# Patient Record
Sex: Female | Born: 1950
Health system: Western US, Academic
[De-identification: ages and names within clinical notes are randomized; demographics above are authoritative.]

## PROBLEM LIST (undated history)

## (undated) DIAGNOSIS — I10 Essential (primary) hypertension: Secondary | ICD-10-CM

## (undated) DIAGNOSIS — M1990 Unspecified osteoarthritis, unspecified site: Secondary | ICD-10-CM

## (undated) DIAGNOSIS — E079 Disorder of thyroid, unspecified: Secondary | ICD-10-CM

## (undated) HISTORY — DX: Disorder of thyroid, unspecified: E07.9

## (undated) HISTORY — PX: PR UNLISTED PROCEDURE LEG/ANKLE: 27899

## (undated) HISTORY — PX: BACK SURGERY: SHX5067

## (undated) HISTORY — PX: PR UNLISTED PROCEDURE SPINE: 22899

## (undated) HISTORY — DX: Unspecified osteoarthritis, unspecified site: M19.90

## (undated) HISTORY — PX: PR UNLISTED PROCEDURE FOREARM/WRIST: 25999

## (undated) HISTORY — PX: PR UNLISTED PROCEDURE PELVIS/HIP JOINT: 27299

## (undated) HISTORY — DX: Essential (primary) hypertension: I10

## (undated) HISTORY — PX: PR CHOLECYSTECTOMY: 47600

## (undated) DEATH — deceased

---

## 2017-06-27 ENCOUNTER — Encounter (HOSPITAL_BASED_OUTPATIENT_CLINIC_OR_DEPARTMENT_OTHER): Payer: PRIVATE HEALTH INSURANCE

## 2017-07-04 ENCOUNTER — Ambulatory Visit: Payer: PRIVATE HEALTH INSURANCE | Attending: Sports Medicine | Admitting: Sports Medicine

## 2017-07-04 ENCOUNTER — Ambulatory Visit (HOSPITAL_BASED_OUTPATIENT_CLINIC_OR_DEPARTMENT_OTHER): Payer: PRIVATE HEALTH INSURANCE

## 2017-07-04 VITALS — BP 145/74 | HR 85 | Temp 97.6°F | Ht 66.06 in | Wt 262.0 lb

## 2017-07-04 DIAGNOSIS — M25511 Pain in right shoulder: Secondary | ICD-10-CM

## 2017-07-04 DIAGNOSIS — M19019 Primary osteoarthritis, unspecified shoulder: Secondary | ICD-10-CM | POA: Insufficient documentation

## 2017-07-04 DIAGNOSIS — M19011 Primary osteoarthritis, right shoulder: Secondary | ICD-10-CM

## 2017-07-04 DIAGNOSIS — Z6841 Body Mass Index (BMI) 40.0 and over, adult: Secondary | ICD-10-CM

## 2017-07-04 NOTE — Patient Instructions (Addendum)
It was a pleasure seeing you in clinic today.  Please let us know if you have any questions for us and we look forward to seeing at your next appointment.    You can schedule an appointment to see us by calling [phone].    You may also find useful information about various orthopaedic conditions and what we do at [email or web address]     In summary, Ms. 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     We discussed the patient's option for treatment.  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  Her primary complaint is pain with overhead use that is worsening.  Her options for treatment would be conservative management consisting of continued NSAIDS, gentle range of motion, and activity modification.  She is retiring towards the end of the year and would like to put off any invasive procedures until that time.      From a surgical standpoint, a total shoulder arthroplasty would be the definitive treatment.  We discussed the benefits, risks, and complications of this.  The patient was also given a handout regarding the procedure and will think about her options.  For the most part, she has continued to maintain her lifestyle up to this point.  We discussed this is an elective procedure and should be done when it is most troublesome for her daily activities.     She will see how she does over the summer and how this is affected by her current work environment and will let us know her plan.  She would like to have a left TSA done prior to retiring.

## 2017-07-04 NOTE — Progress Notes (Signed)
[facility] Department of Orthopaedics & Sports Medicine  Shoulder And Elbow Service       [facility]; [address] [place] ; [place], [place]  [address]  [phone]; [phone]    [email or web address]    Primary Care Provider:  Unknown PCP   A Patient Who Has A Pcp But Is Unsure Of The Name    Referring Provider:  Brendia Sacks  [facility] - Neurology  [address], [place] [address]    Patient Care Team:  No providers found        Subjective History  We had the most wonderful pleasure of seeing Ms. Elpidio Galea in our Shoulder and Elbow Clinic at the [facility] for a clinical visit.  She is a most interesting 67 year old female     The patient presents today with right shoulder pain that has been present for many years.  She denies injury or trauma.  No history of surgery.  Her main complaint is that she is unable to do overhead lifting without pain.  Also, she is unable to sleep on that side and has compromised by placing a pillow under her right shoulder.  She has taken diclofenac sparingly for her symptoms which does help at times.  Otherwise, she has not had injection or done PT.  She recently had a right Guyon canal release and her sensory issues in the right ulnar nerve distribution are slowly improving.  Additionally, she has a history of cervical and lumbar surgery and has done fairly well following both of these surgeries.    She lives in [place] and works for the [employer].  She is retiring towards the end of this year.  She enjoys swimming and continues to do this weekly.  She is left hand dominant       Related Information   Chief Complaint   Patient presents with    Musculoskeletal Problem     R Shoulder Pain   Handed: left handed     Work Related Problem: No    Is a lawyer involved with this problem: No     History of Present Illness  1. Location - where is the problem located? Right  Shoulder    2. Severity - Intensity of Pain/discomfort: (1 = No Pain, 10 = Severe Pain): 5     3. Context - How did this problem begin? Refer to subjective note above.     4. Modifying Factors -  What makes symptom(s) worse? Don't know    What improves your symptom(s)? No known modifying factors that make it better       Special Questionnaires   Right Left    SANE (Single Assessment Numeric Evaluation)  60% 90%      When applicable, Simple Shoulder Test and/or Simple Elbow Test data is available in the patient's medical record.        Review of Systems  Constitutional: negative for weight gain, weight loss, fatigue, insomnia, fever and night-sweats/chills   Eyes: glasses/contacts    Ear/Nose/Throat: ringing in ears   Cardiovascular: negative for irregular heartbeat, high blood pressure, chest pain, fluttering in chest and coronary disease   Respiratory: negative for shortness of breath, difficulty breathing, lung disease and persistent cough   Gastrointestinal: negative for decreased appetite, constipation, heartburn, nausea, diarrhea and hepatitis   Musculoskeletal:  arthritis   Genitourinary: negative for kidney stone, bladder/kidney infections,  prostate problems and painful urinating   Skin/Integument:  negative for masses, blisters, non-healing wounds and dermatitis   Neurological:  negative for seizures, tingling, numbness and severe headaches   Psychiatric:  negative for anxiety, depression or other mental health issues   Endocrine:  thyroid disorder   Blood/Lymphatic: negative for bleeding or clotting problems, anemia and swollen or enlarged lymph nodes   Immunological: negative for HIV/AIDS, Sjgren's syndrome, scleroderma, hay fever and lupus   Cancer: negative for cancer        Other History      Social History     Occupational History    Not on file   Tobacco Use    Smoking status: Not on file   Substance and Sexual Activity    Alcohol use: Not on file    Drug use: Not on file    Sexual activity: Not  on file   Marital Status: Single   Number of adults living with Ms. Applin: 2      No past medical history on file.  No past surgical history on file.Review of patient's allergies indicates:  Allergies   Allergen Reactions    Lasix [Furosemide]      Liver damage       No current outpatient medications on file.     No current facility-administered medications for this visit.           Physical Examination  Please note that a for the findings below:       "-" signifies a Negative finding       "+" signifies a Positive finding       a blank denotes test was not performed or documented    Constitutional:  General appearance:  Ms. Dimaria is a well developed, well nourished female in no apparent distress.   BP (!) 145/74    Pulse 85    Temp 97.6 F (36.4 C) (Temporal)    Ht 5' 6.06" (1.678 m)    Wt (!) 262 lb (118.8 kg)    SpO2 98%    BMI 42.21 kg/m     Psychological:  Her judgment, insight, memory, mood and affect appear to be within normal limits    Neurological:  She is alert and oriented without any obvious gross neurological deficits    Respiratory:  She is without any obvious respiratory distress    ENT:  She is able to hear and understand verbal questions and commands    Upper Extremities:   Cardiovascular Inspection: Right Left   Edema Negative Negative        Respiratory: Right Left   Cyanosis Negative Negative        Hematologic: Right Left   Ecchymosis Negative Negative        Skin Inspection:  Right Left   Rashes  Negative Negative   Lesions Negative Negative   Ulcers Negative Negative   Erythema Negative Negative   Signs of infection Negative Negative        SHOULDER     Musculoskeletal Inspection-Visual:  Right Left   Obvious Musculoskeletal Deformity Negative Negative   Scapular Dyskinesis Negative Negative   AC Joint Asymmetry Negative Negative   SC Joint Asymmetry Negative Negative   Popeye Biceps deformity Negative Negative        Musculoskeletal Inspection-Palpation:  Right Left   Shoulder Crepitus   positive Negative   Rotator Cuff Defect Palpated Negative Negative   Pain With Palpation of AC Joint Negative Negative   Painful Palpation  of Bicipital Groove Negative Negative   Other Painful Palpation Negative Negative                  Range of Motion of Shoulder: Right Left   Total Active Forward Elevation 130 140   Total Active External Rotation 20 40   Internal Rotation Back Back pocket T7   External Rotation Abducted 70 90   Internal Rotation Abducted 20 70   Cross Body Adduction 25 15        Strength of Shoulder: Right Left   Supraspinatus  (Suprascapular nerve/C5, C6) 5 5   External Rotation  (Infraspinatus, Teres minor/Suprascapular nerve, Axillary nerve/C5, C6) 5 5   Internal Rotation/Belly Press  (subscapularis/ Upper and lower subscapular nerves/C5,C6) 5 5   Deltoid Abduction  (Deltoid/Axillary nerve/C5, C6) 5 5   Pseudoparalysis Negative Negative   External Rotation Lag Negative Negative        Stability Tests of Shoulder: Right Left   Obvious Gross Instability  Negative Negative   Anterior Superior Escape Negative Negative   Speed's (biceps/labral) Negative Negative        Neurological: Right Left   Decreased Sensation Small Finger  (Ulnar Nerve/C8) Decreased and same since her right ulnar nerve surgery Negative   Decreased Sensation Distal Middle and Index Finger  (Median Nerve/C7) Negative Negative   Decreased Sensation Dorsal Proximal Thumb  (Radial Nerve/C6) Negative Negative   Decreased Sensation Lateral Forearm  (Lateral antebrachial cutaneous /C6) Negative Negative   Decreased Sensation Medial Forearm  (Median brachial cutaneous/T1) Negative Negative   Decreased Sensation Lateral Deltoid  (C5) Negative Negative   Decreased Sensation Medial Upper Arm  (T1) Negative Negative        Other Tests: Right Left               Diagnostic Tests and Studies   X-ray Studies:  xrays of the right shoulder demonstrate GHJ degenerative changes.  There is an inferior HH osteophyte with joint space narrowing.   There is also irregularity of the greater tuberosity.  There is minimal decentering on the axillary view    Other Imaging Studies:  Other imaging studies were not obtained or available for review today.    Laboratory:  None       Assessment  and Plan   In summary, Ms. 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     We discussed the patient's option for treatment.  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  Her primary complaint is pain with overhead use that is worsening.  Her options for treatment would be conservative management consisting of continued NSAIDS, gentle range of motion, and activity modification.  She is retiring towards the end of the year and would like to put off any invasive procedures until that time.      From a surgical standpoint, a total shoulder arthroplasty would be the definitive treatment.  We discussed the benefits, risks, and complications of this.  The patient was also given a handout regarding the procedure and will think about her options.  For the most part, she has continued to maintain her lifestyle up to this point.  We discussed this is an elective procedure and should be done when it is most troublesome for her daily activities.     She will see how she does over the summer and how this is affected by  her current work environment and will let us know her plan.  She would like to have a left TSA done prior to retiring.  We discussed the importance of continuing to stay active with swimming and walking leading up to when she would like to have surgery done.  We discussed the importance of maintaining a healthy weight and the importance of this in minimizing complications following surgery.      She will follow up when she is ready to discuss surgery.    The patient was seen and examined by Dr. Bonnell Public, DO  Orthopaedics and Sports  Medicine  [facility]  Shoulder and Elbow Team    I saw and evaluated this patient, reviewed their history and imaging studies, and repeated a directed examination, which the fellow recorded in the medical record.  Historically, she endorses pain, popping and stiffness of her right non dominant shoulder getting worse over time.  On physical exam I noted decreased R shoulder ROM, crepitus but good strength.          On my person review imaging reveals osteophytosis and narrowing of the apparent joint space best seen at the blue arrow.    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     Plan: We will monitor this condition over time.  The vast majority of cases gradually get worse and an arthroplasty becomes necessary for pain relief.  In the interim, the judicious use of NSAIDS is also reasonable, being sure to take them with food or milk and not to exceed prescribed dosages.  Moreover, the patient should stop taking these if there are any gastrointestinal discomfort.    We are in no hurry to perform surgery as long as the patient's functional level is acceptable to them and there is no ongoing erosion of glenoid bone stock.  To monitor this we should check her plain films on an annual basis or every few years.        Moses Manners, M.D.  Sports, Shoulder and Elbow Surgery  Chief, Shoulder and Elbow Surgery  Director,  Shoulder and Elbow Fellowship  Orthopaedics and Sports Medicine  [facility]

## 2017-07-08 ENCOUNTER — Encounter (HOSPITAL_BASED_OUTPATIENT_CLINIC_OR_DEPARTMENT_OTHER): Payer: Self-pay | Admitting: Sports Medicine

## 2022-05-25 IMAGING — MR MRI LUMBAR SPINE WITHOUT CONTRAST
7 of 8 series · 15 of 48 positions shown · IV contrast (gadolinium)
Comparison: None

________________________________________________________________________________________________ 
MRI LUMBAR SPINE WITHOUT CONTRAST, 05/25/2022 [DATE]: 
CLINICAL INDICATION: Right-sided sciatica. Posterior leg pain and numbness and 
tingling.
TECHNIQUE: Multiplanar, multiecho position MR images of the lumbar spine were 
performed without intravenous gadolinium enhancement. Patient was scanned on a 
1.5T magnet.

[Series 101: survey · axial · 10.0mm · 1.25mm/px · z∈[-33,+201]mm · 2 of 10 slices shown]
[im 1/10]
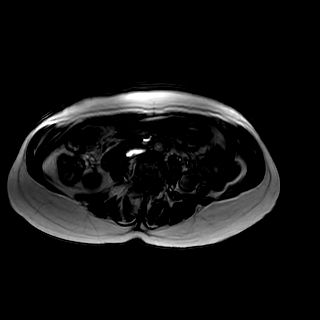
[im 10/10]
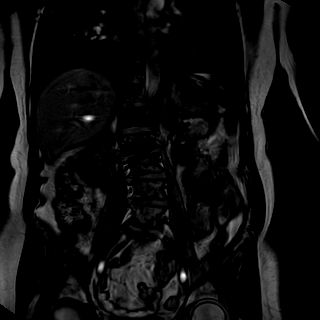

[Series 201: t2w_cor-surv · coronal · 6.0mm · 0.62mm/px · 2 of 10 slices shown]
[im 1/10]
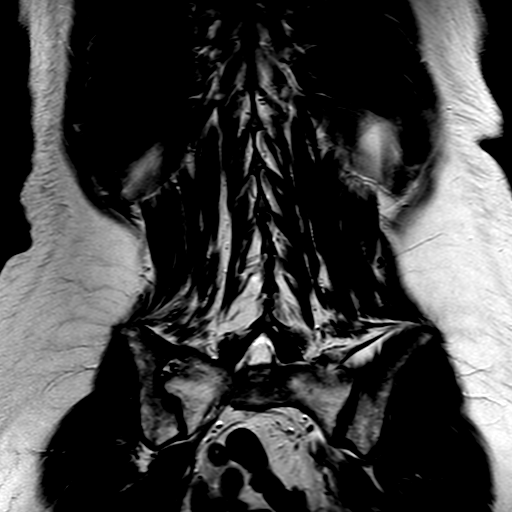
[im 10/10]
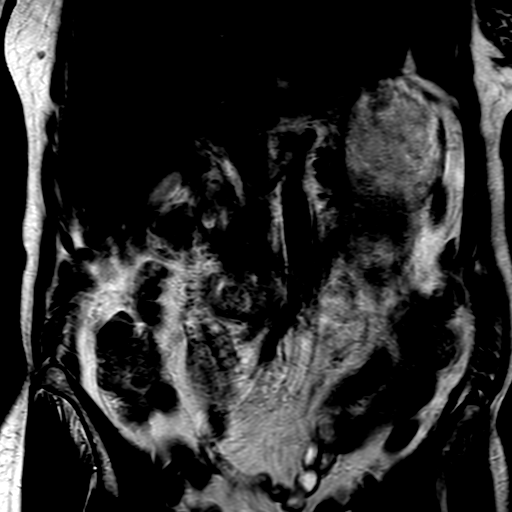

[Series 301: T1 · sagittal · 4.0mm · 0.46mm/px · 2 of 17 slices shown]
[im 1/17]
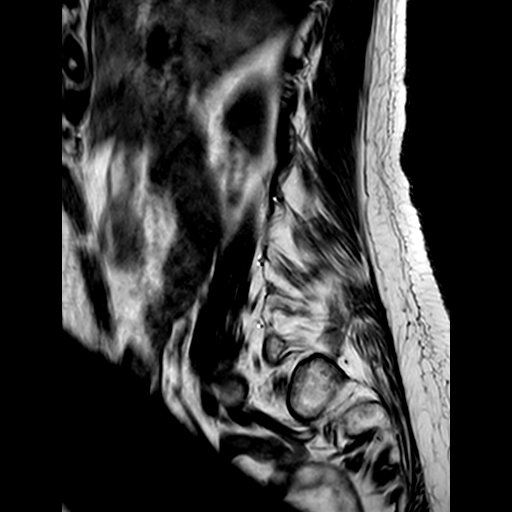
[im 17/17]
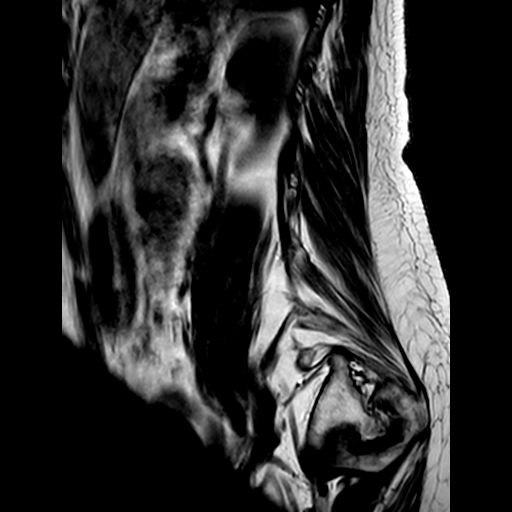

[Series 402: (id)_mdixon_tse · sagittal · 4.0mm · 0.37mm/px · 2 of 17 slices shown]
[im 1/17]
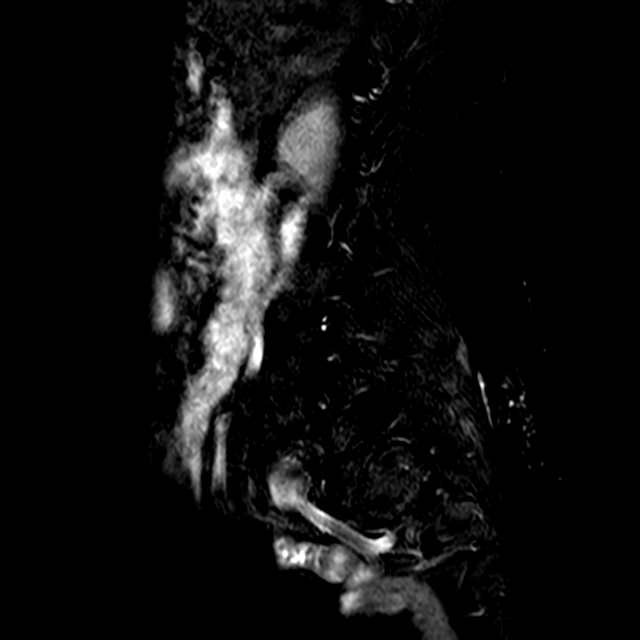
[im 17/17]
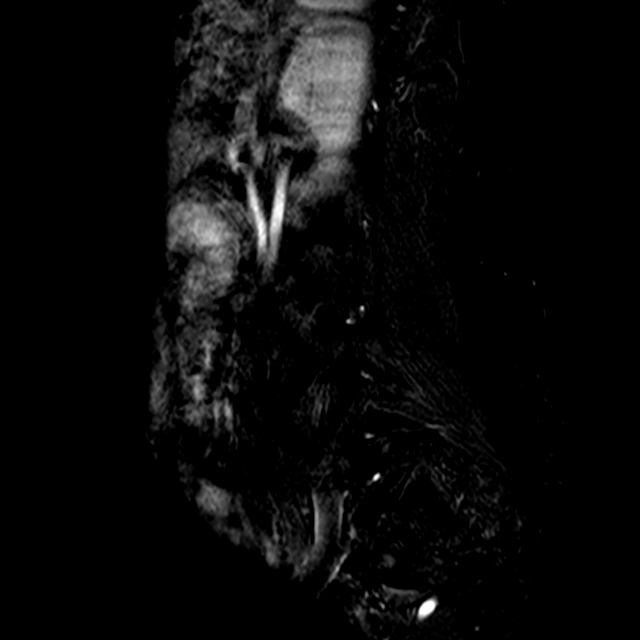

[Series 403: st2w_mdixon_tse · sagittal · 4.0mm · 0.37mm/px · 2 of 17 slices shown]
[im 1/17]
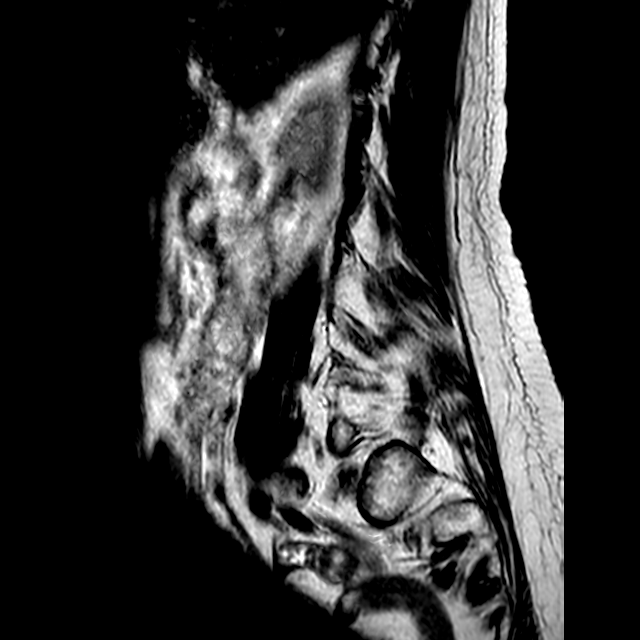
[im 17/17]
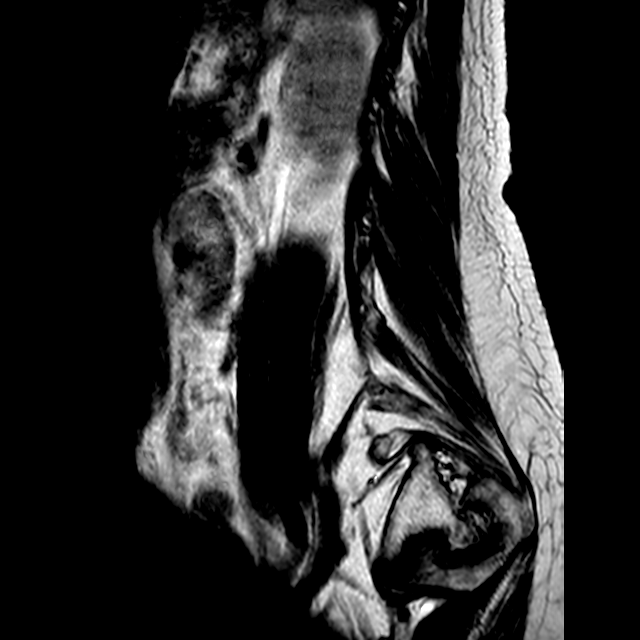

[Series 501: sag spineview ax · sagittal · 1.4mm · 0.36mm/px · 1 of 114 slices shown]
[im 1/114]
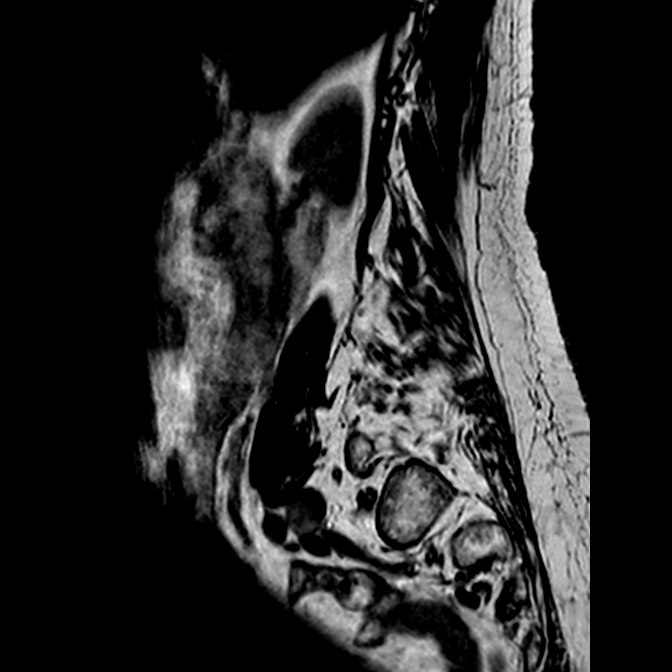

[Series 601: T2 · oblique · 4.0mm · 0.30mm/px · 4 of 30 slices shown]
[im 1/30]
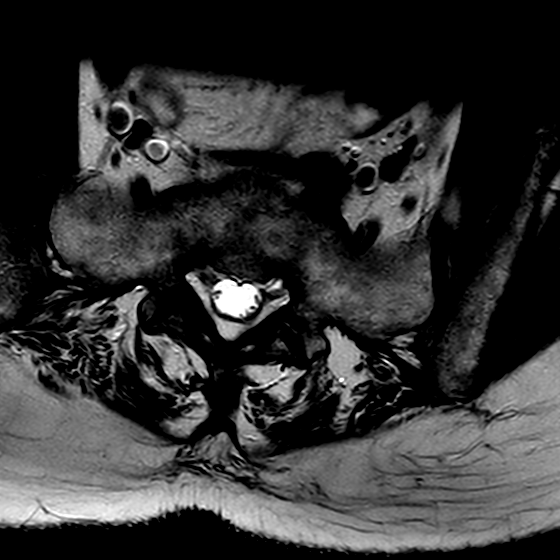
[im 10/30]
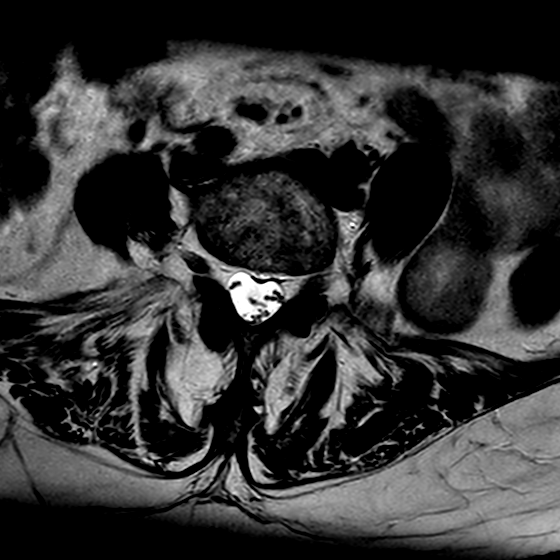
[im 20/30]
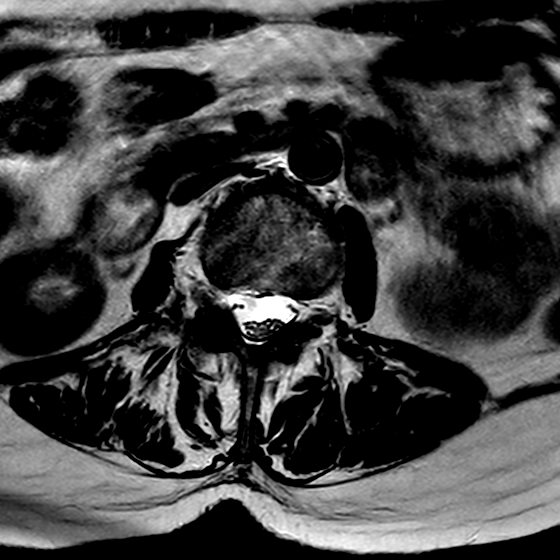
[im 30/30]
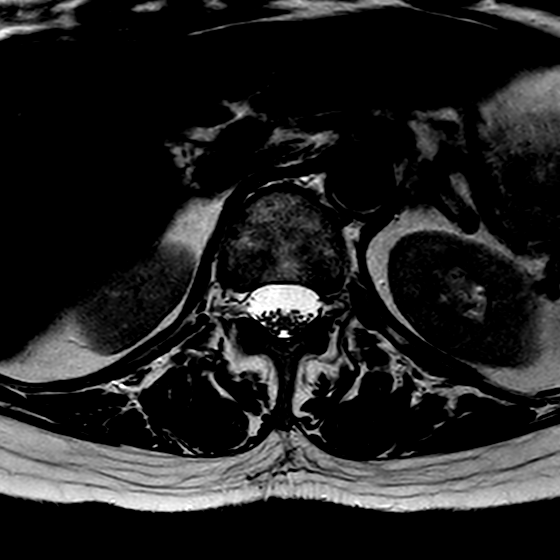

[15 of 48 positions shown; findings below may reference images not displayed]

FINDINGS: -------------------------------------------------------------------------------- 
------ 
GENERAL: 
Nomenclature is based on 5 lumbar type vertebral bodies.     
ALIGNMENT: Mild to moderate levoconvex lumbar scoliosis apex to the left at L3 
level. There is trace grade 1 retrolisthesis L1 on L2 and L2 on L3. Trace grade 
1 retrolisthesis L3 on L4 and L4 on L5. 11 mm anterolisthesis L5 on S1 with 
bilateral L5 spondylolysis. 
VERTEBRAL BODY HEIGHT: Normal.  
MARROW SIGNAL: No focal suspect signal abnormality. 
CORD SIGNAL: Normal distal spinal cord and cauda equina. Conus medullaris 
terminates at superior L1. 
ADDITIONAL FINDINGS: None. 
Modic I-II: L1-L2 
Ligamentum Flavum > 2.5 mm: All levels. 
-------------------------------------------------------------------------------- 
------ 
SEGMENTAL: 
T12-L1: Slight loss of disc signal. Minimal annular bulge. Otherwise normal. 
L1-L2: Moderate to severe loss of disc height and signal. Marginal osteophytes. 
Annular bulge with mild canal stenosis. Facet arthropathy with small left facet 
joint effusion. Foramina patent. 
L2-L3: Moderate to severe loss of disc height specifically to the right. Loss of 
disc signal. Mild diffuse annular bulge. There is osteophytic ridging in the 
right paracentral zone, see image 9 of series 403 with slight narrowing right 
lateral recess. Mild canal stenosis. Small left facet joint effusion. Borderline 
right foraminal narrowing. Left foramen patent. 
L3-L4: Mild to moderate loss of disc height specifically to the right. Loss of 
disc signal. Annular bulge with mild canal stenosis and facet narrowing right 
lateral recess. Borderline right foraminal narrowing. Left foramen patent. 
L4-L5: Moderate loss of disc height posteriorly. Loss of disc signal. 
Retrolisthesis with disc unroofing. Canal patent. Mild bilateral facet 
arthropathy. Foramina patent. 
L5-S1: Moderate loss of disc height. Loss of disc signal. Anterolisthesis with 
disc uncovering. There is narrowing of the lateral recesses bilaterally. Canal 
otherwise patent. Foramina are elongated and moderately to severely narrowed 
bilaterally. Facet arthropathy. 
-------------------------------------------------------------------------------- 
------
IMPRESSION: Degenerative and scoliotic changes. 11 mm anterolisthesis L5 on S1 from 
bilateral L5 spondylolysis. 
No critical or significant central canal narrowing with variable lateral recess 
and foraminal narrowing detailed above. Most significant foraminal narrowing is 
at L5-S1, moderate to severe bilaterally.
